# Patient Record
Sex: Male | Born: 1995 | Race: White | Hispanic: No | Marital: Single | Smoking: Current every day smoker
Health system: Southern US, Community
[De-identification: ages and names within clinical notes are randomized; demographics above are authoritative.]

## PROBLEM LIST (undated history)

## (undated) DIAGNOSIS — J45909 Unspecified asthma, uncomplicated: Secondary | ICD-10-CM

## (undated) DIAGNOSIS — F39 Unspecified mood [affective] disorder: Secondary | ICD-10-CM

## (undated) DIAGNOSIS — F32A Depression, unspecified: Secondary | ICD-10-CM

## (undated) DIAGNOSIS — Z872 Personal history of diseases of the skin and subcutaneous tissue: Secondary | ICD-10-CM

## (undated) DIAGNOSIS — F329 Major depressive disorder, single episode, unspecified: Secondary | ICD-10-CM

## (undated) DIAGNOSIS — L709 Acne, unspecified: Secondary | ICD-10-CM

## (undated) DIAGNOSIS — T4271XA Poisoning by unspecified antiepileptic and sedative-hypnotic drugs, accidental (unintentional), initial encounter: Secondary | ICD-10-CM

## (undated) DIAGNOSIS — F1211 Cannabis abuse, in remission: Secondary | ICD-10-CM

## (undated) DIAGNOSIS — K219 Gastro-esophageal reflux disease without esophagitis: Secondary | ICD-10-CM

## (undated) HISTORY — DX: Poisoning by unspecified antiepileptic and sedative-hypnotic drugs, accidental (unintentional), initial encounter: T42.71XA

## (undated) HISTORY — DX: Cannabis abuse, in remission: F12.11

## (undated) HISTORY — DX: Major depressive disorder, single episode, unspecified: F32.9

## (undated) HISTORY — DX: Acne, unspecified: L70.9

## (undated) HISTORY — DX: Depression, unspecified: F32.A

## (undated) HISTORY — DX: Gastro-esophageal reflux disease without esophagitis: K21.9

## (undated) HISTORY — PX: TONSILLECTOMY: SUR1361

## (undated) HISTORY — DX: Personal history of diseases of the skin and subcutaneous tissue: Z87.2

---
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
## 2014-01-06 ENCOUNTER — Ambulatory Visit (INDEPENDENT_AMBULATORY_CARE_PROVIDER_SITE_OTHER): Payer: Medicaid Other | Admitting: Family Medicine

## 2014-01-06 ENCOUNTER — Ambulatory Visit: Payer: Self-pay | Admitting: Family Medicine

## 2014-01-06 ENCOUNTER — Encounter: Payer: Self-pay | Admitting: Family Medicine

## 2014-01-06 VITALS — BP 101/66 | HR 86 | Temp 99.8°F | Resp 18 | Ht 68.0 in | Wt 158.0 lb

## 2014-01-06 DIAGNOSIS — L0390 Cellulitis, unspecified: Secondary | ICD-10-CM

## 2014-01-06 DIAGNOSIS — L0291 Cutaneous abscess, unspecified: Secondary | ICD-10-CM

## 2014-01-06 DIAGNOSIS — Z872 Personal history of diseases of the skin and subcutaneous tissue: Secondary | ICD-10-CM

## 2014-01-06 NOTE — Progress Notes (Signed)
Office Note 01/07/2014  CC:  Chief Complaint  Patient presents with  . Establish Care  . Cyst    right leg x a few months    HPI:  Alejandro Morales is a 18 y.o. White male who is here today to establish care and get a spot on his leg evaluated. Patient's most recent primary MD: Dr. Foy GuadalajaraFried at [facility] in [place]their office no longer accepts his insurance per pt report today. Old records in epic/[facility] EMR were reviewed prior to or during today's visit.  Hx of frequent cysts in the past--face, axillae, groin.  Has been on daily minocycline for this for about a year. Has had to have multiple cysts I&D'd in the past.  No specialist seen for them.  Currently has one on upper portion of right leg that has been waxing and waning in size for a couple of months. It is draining fluid.  No fever.  No malaise.  The area is painful.  Has not been taking anything for the pain. Has not tried heat "b/c that has never worked in the past". Denies hx of STD.  No penile d/c or lesion.   Past Medical History  Diagnosis Date  . Asthma   . Mood disorder     Anger management and mood disorder.  ? ADHD  . Acne 07/06/2012  . Overdose of anticonvulsant 07/06/2012    Overdosed on lamictal when being rx'd this med for possible bipolar disorder.  Also attempted overdose with oxycontin solution after his mother's death from hepatic failure  . Depression   . GERD (gastroesophageal reflux disease)   . H/O hidradenitis suppurativa   . History of cannabis abuse     Past Surgical History  Procedure Laterality Date  . Tonsillectomy      Family History  Problem Relation Age of Onset  . Kidney disease Mother   . Hypertension Father   . Bipolar disorder Sister     History   Social History  . Marital Status: Single    Spouse Name: N/A    Number of Children: N/A  . Years of Education: N/A   Occupational History  . Student     10th grade at [employer]   Social History Main Topics  . Smoking  status: Current Every Day Smoker -- 1.00 packs/day for 4 years    Types: Cigarettes  . Smokeless tobacco: Never Used  . Alcohol Use: No     Comment: every two months to intoxication  . Drug Use: No     Comment: last use two weeks ago  . Sexual Activity: Yes    Partners: Female    Birth Control/ Protection: Condom   Other Topics Concern  . Not on file   Social History Narrative   Pt got GED, was going to [employer] prior to this.   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   Tobacco: 1 ppd since age 18.     Alcohol: none.  Drugs: none currently (THC in the past)    Outpatient Encounter Prescriptions as of 01/06/2014  Medication Sig  . albuterol (PROVENTIL HFA;VENTOLIN HFA) 108 (90 BASE) MCG/ACT inhaler Inhale 2 puffs into the lungs every 6 (six) hours as needed.  Marland Kitchen. buPROPion (WELLBUTRIN XL) 300 MG 24 hr tablet Take 1 tablet (300 mg total) by mouth daily.  . Fluticasone-Salmeterol (ADVAIR) 100-50 MCG/DOSE AEPB Inhale 1 puff into the lungs every 12 (twelve) hours.  . minocycline (MINOCIN,DYNACIN) 100 MG capsule Take 100 mg by mouth 2 (  two) times daily.  . montelukast (SINGULAIR) 10 MG tablet Take 10 mg by mouth at bedtime.  Marland Kitchen loratadine (CLARITIN) 10 MG tablet Take 1 tablet (10 mg total) by mouth daily.  Marland Kitchen MELATONIN PO Take 1 tablet by mouth at bedtime.  . [DISCONTINUED] cephALEXin (KEFLEX) 500 MG capsule Take 500 mg by mouth 4 (four) times daily.    No Known Allergies  ROS See HPI PE; Blood pressure 101/66, pulse 86, temperature 99.8 F (37.7 C), temperature source Temporal, resp. rate 18, height 5\' 8"  (1.727 m), weight 158 lb (71.668 kg), SpO2 98.00%. ZOX:WRUE: no injection, icteris, swelling, or exudate.  EOMI, PERRLA. Mouth: lips without lesion/swelling.  Oral mucosa pink and moist. Oropharynx without erythema, exudate, or swelling.  CV: RRR, no m/r/g.   LUNGS: CTA bilat, nonlabored resps, good aeration in all lung fields. Groin: right thigh superior-most region anteriorly  (lateral to groin crease) has a 2 cm tender subQ nodule with minimal induration and no significant surrounding erythema or fluctuance.  This surface of this appears to have a small pustule/vesicle.  Pertinent labs:  None today  ASSESSMENT AND PLAN:   New pt; obtain old records.  H/O hidradenitis suppurativa Currently with a very small abscess on upper/inner right thigh, draining some on and off but pt voices desire to have it opened up.  Procedure: Incision and drainage of abscess/cyst.  The indication for the procedure was explained to the patient, benefits and risks of procedure were outlined for patient, patient agreed to proceed.  Steps of the procedure were clearly explained to the patient prior to starting. Injected lesion with 2 ml of 1% lidocaine with epinephrine for local anesthesia.  Incised central portion of lesion with scalpel and used manual pressure and hemostats to express contents and encourage complete drainage.  Culture swab of lesion contents obtained and sent to lab.  Wound dressed.  No bleeding.  Patient tolerated procedure well.  No immediate complications.  Wound care instructions given.  Warning signs of infection discussed. Follow up discussed.  Call or return for problems. Continue minocycline 100mg  bid chronically.    An After Visit Summary was printed and given to the patient.  Return if symptoms worsen or fail to improve.

## 2014-01-06 NOTE — Progress Notes (Signed)
Pre visit review using our clinic review tool, if applicable. No additional management support is needed unless otherwise documented below in the visit note. 

## 2014-01-07 ENCOUNTER — Encounter: Payer: Self-pay | Admitting: Family Medicine

## 2014-01-07 ENCOUNTER — Telehealth: Payer: Self-pay | Admitting: Family Medicine

## 2014-01-07 DIAGNOSIS — L0291 Cutaneous abscess, unspecified: Secondary | ICD-10-CM | POA: Insufficient documentation

## 2014-01-07 DIAGNOSIS — Z872 Personal history of diseases of the skin and subcutaneous tissue: Secondary | ICD-10-CM | POA: Insufficient documentation

## 2014-01-07 NOTE — Telephone Encounter (Signed)
Already done

## 2014-01-07 NOTE — Telephone Encounter (Signed)
Relevant patient education mailed to patient.  

## 2014-01-07 NOTE — Assessment & Plan Note (Signed)
Currently with a very small abscess on upper/inner right thigh, draining some on and off but pt voices desire to have it opened up.  Procedure: Incision and drainage of abscess/cyst.  The indication for the procedure was explained to the patient, benefits and risks of procedure were outlined for patient, patient agreed to proceed.  Steps of the procedure were clearly explained to the patient prior to starting. Injected lesion with 2 ml of 1% lidocaine with epinephrine for local anesthesia.  Incised central portion of lesion with scalpel and used manual pressure and hemostats to express contents and encourage complete drainage.  Culture swab of lesion contents obtained and sent to lab.  Wound dressed.  No bleeding.  Patient tolerated procedure well.  No immediate complications.  Wound care instructions given.  Warning signs of infection discussed. Follow up discussed.  Call or return for problems. Continue minocycline 100mg  bid chronically.

## 2014-01-09 LAB — WOUND CULTURE
GRAM STAIN: NONE SEEN
GRAM STAIN: NONE SEEN

## 2014-04-28 ENCOUNTER — Emergency Department (HOSPITAL_COMMUNITY): Payer: No Typology Code available for payment source

## 2014-04-28 ENCOUNTER — Emergency Department (HOSPITAL_COMMUNITY)
Admission: EM | Admit: 2014-04-28 | Discharge: 2014-04-28 | Disposition: A | Discharge status: Home / Self Care | Payer: No Typology Code available for payment source | Attending: Emergency Medicine | Admitting: Emergency Medicine

## 2014-04-28 ENCOUNTER — Encounter (HOSPITAL_COMMUNITY): Payer: Self-pay | Admitting: Emergency Medicine

## 2014-04-28 DIAGNOSIS — Y9389 Activity, other specified: Secondary | ICD-10-CM | POA: Diagnosis not present

## 2014-04-28 DIAGNOSIS — Z8719 Personal history of other diseases of the digestive system: Secondary | ICD-10-CM | POA: Diagnosis not present

## 2014-04-28 DIAGNOSIS — IMO0002 Reserved for concepts with insufficient information to code with codable children: Secondary | ICD-10-CM | POA: Insufficient documentation

## 2014-04-28 DIAGNOSIS — R109 Unspecified abdominal pain: Secondary | ICD-10-CM | POA: Insufficient documentation

## 2014-04-28 DIAGNOSIS — Y92410 Unspecified street and highway as the place of occurrence of the external cause: Secondary | ICD-10-CM | POA: Diagnosis not present

## 2014-04-28 DIAGNOSIS — Z87828 Personal history of other (healed) physical injury and trauma: Secondary | ICD-10-CM | POA: Insufficient documentation

## 2014-04-28 DIAGNOSIS — Z79899 Other long term (current) drug therapy: Secondary | ICD-10-CM | POA: Insufficient documentation

## 2014-04-28 DIAGNOSIS — Z872 Personal history of diseases of the skin and subcutaneous tissue: Secondary | ICD-10-CM | POA: Diagnosis not present

## 2014-04-28 DIAGNOSIS — J45909 Unspecified asthma, uncomplicated: Secondary | ICD-10-CM | POA: Insufficient documentation

## 2014-04-28 DIAGNOSIS — S0990XA Unspecified injury of head, initial encounter: Secondary | ICD-10-CM | POA: Diagnosis present

## 2014-04-28 DIAGNOSIS — S32009A Unspecified fracture of unspecified lumbar vertebra, initial encounter for closed fracture: Secondary | ICD-10-CM

## 2014-04-28 DIAGNOSIS — F17200 Nicotine dependence, unspecified, uncomplicated: Secondary | ICD-10-CM | POA: Diagnosis not present

## 2014-04-28 DIAGNOSIS — Z8659 Personal history of other mental and behavioral disorders: Secondary | ICD-10-CM | POA: Insufficient documentation

## 2014-04-28 DIAGNOSIS — S060X1A Concussion with loss of consciousness of 30 minutes or less, initial encounter: Secondary | ICD-10-CM

## 2014-04-28 DIAGNOSIS — T07XXXA Unspecified multiple injuries, initial encounter: Secondary | ICD-10-CM

## 2014-04-28 LAB — COMPREHENSIVE METABOLIC PANEL
ALBUMIN: 4.6 g/dL (ref 3.5–5.2)
ALK PHOS: 81 U/L (ref 39–117)
ALT: 69 U/L — AB (ref 0–53)
ANION GAP: 22 — AB (ref 5–15)
AST: 48 U/L — ABNORMAL HIGH (ref 0–37)
BUN: 9 mg/dL (ref 6–23)
CALCIUM: 9.4 mg/dL (ref 8.4–10.5)
CO2: 18 mEq/L — ABNORMAL LOW (ref 19–32)
Chloride: 100 mEq/L (ref 96–112)
Creatinine, Ser: 0.63 mg/dL (ref 0.50–1.35)
GFR calc Af Amer: >90 mL/min (ref >90)
GFR calc non Af Amer: >90 mL/min (ref >90)
Glucose, Bld: 105 mg/dL — ABNORMAL HIGH (ref 70–99)
POTASSIUM: 4.4 meq/L (ref 3.7–5.3)
SODIUM: 140 meq/L (ref 137–147)
TOTAL PROTEIN: 7.8 g/dL (ref 6.0–8.3)
Total Bilirubin: 0.4 mg/dL (ref 0.3–1.2)

## 2014-04-28 LAB — I-STAT CHEM 8, ED
BUN: 8 mg/dL (ref 6–23)
CHLORIDE: 105 meq/L (ref 96–112)
CREATININE: 0.9 mg/dL (ref 0.50–1.35)
Calcium, Ion: 1.13 mmol/L (ref 1.12–1.23)
Glucose, Bld: 104 mg/dL — ABNORMAL HIGH (ref 70–99)
HCT: 53 % — ABNORMAL HIGH (ref 39.0–52.0)
Hemoglobin: 18 g/dL — ABNORMAL HIGH (ref 13.0–17.0)
Potassium: 4.1 mEq/L (ref 3.7–5.3)
SODIUM: 140 meq/L (ref 137–147)
TCO2: 20 mmol/L (ref 0–100)

## 2014-04-28 LAB — CBC
HCT: 47.1 % (ref 39.0–52.0)
HEMOGLOBIN: 16.5 g/dL (ref 13.0–17.0)
MCH: 32.5 pg (ref 26.0–34.0)
MCHC: 35 g/dL (ref 30.0–36.0)
MCV: 92.9 fL (ref 78.0–100.0)
Platelets: 254 10*3/uL (ref 150–400)
RBC: 5.07 MIL/uL (ref 4.22–5.81)
RDW: 13.3 % (ref 11.5–15.5)
WBC: 21.9 10*3/uL — AB (ref 4.0–10.5)

## 2014-04-28 LAB — ETHANOL: Alcohol, Ethyl (B): 172 mg/dL — ABNORMAL HIGH (ref 0–11)

## 2014-04-28 MED ORDER — OXYCODONE-ACETAMINOPHEN 5-325 MG PO TABS: 2.0000 | ORAL_TABLET | ORAL | Status: AC | PRN

## 2014-04-28 MED ORDER — OXYCODONE-ACETAMINOPHEN 5-325 MG PO TABS
2.0000 | ORAL_TABLET | Freq: Once | ORAL | Status: AC
  Administered 2014-04-28: 2 via ORAL
  Filled 2014-04-28: qty 2

## 2014-04-28 MED ORDER — MORPHINE SULFATE 4 MG/ML IJ SOLN
4.0000 mg | Freq: Once | INTRAMUSCULAR | Status: AC
  Administered 2014-04-28: 4 mg via INTRAVENOUS
  Filled 2014-04-28: qty 1

## 2014-04-28 MED ORDER — ONDANSETRON 8 MG PO TBDP: 8.0000 mg | ORAL_TABLET | Freq: Three times a day (TID) | ORAL | Status: AC | PRN

## 2014-04-28 MED ORDER — FENTANYL CITRATE 0.05 MG/ML IJ SOLN
50.0000 ug | Freq: Once | INTRAMUSCULAR | Status: AC
  Administered 2014-04-28: 50 ug via INTRAVENOUS
  Filled 2014-04-28: qty 2

## 2014-04-28 MED ORDER — FENTANYL CITRATE 0.05 MG/ML IJ SOLN
50.0000 ug | Freq: Once | INTRAMUSCULAR | Status: AC
Start: 2014-04-28 — End: 2014-04-28
  Administered 2014-04-28: 50 ug via INTRAVENOUS

## 2014-04-28 MED ORDER — IOHEXOL 300 MG/ML  SOLN
100.0000 mL | Freq: Once | INTRAMUSCULAR | Status: AC | PRN
  Administered 2014-04-28: 100 mL via INTRAVENOUS

## 2014-04-28 NOTE — ED Notes (Signed)
Reported to Dr. Norlene Campbelltter that patient is requesting more pain medication, and reviewed BP and HR.  MD acknowledges, and will enter orders.

## 2014-04-28 NOTE — ED Notes (Signed)
Patient ambulated with 2 person assist with unsteady gait approx. 75 feet.  Returned to room.  Dr. Norlene Campbelltter considering x-ray of legs as part of plan of care.
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
## 2014-04-28 NOTE — ED Notes (Signed)
Phlebotomy is at the bedside 

## 2014-04-28 NOTE — ED Notes (Signed)
Called x-ray. Patient will be next to be picked up next.

## 2014-04-28 NOTE — ED Notes (Signed)
Patient's clothes removed, warm blankets under and on top of patient.

## 2014-04-28 NOTE — ED Notes (Signed)
Patient's family, dad, and grandmother, are present at the bedside and will drive patient home.
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
## 2014-04-28 NOTE — ED Notes (Signed)
Father and grandmother at the bedside.

## 2014-04-28 NOTE — ED Notes (Signed)
Reported to Dr. Norlene Campbelltter of patient's arrival on [facility]board [facility], and found removing equipment.  Patient currently wearing equipment as he should.  MD acknowledges, no new orders received.
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
## 2014-04-28 NOTE — ED Notes (Signed)
Multiple abrasions noted.  Annotated on physical diagram.  Mild swelling of upper lip, no internal injuries noted on inside of mouth.

## 2014-04-28 NOTE — ED Notes (Signed)
Pt dressed in Wine color scrubs.

## 2014-04-28 NOTE — ED Notes (Signed)
Dr. Otter at the bedside.  
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
